# Patient Record
Sex: Female | Born: 1985 | Race: Black or African American | Hispanic: Yes
Health system: Midwestern US, Community
[De-identification: ages and names within clinical notes are randomized; demographics above are authoritative.]

## PROBLEM LIST (undated history)

## (undated) DIAGNOSIS — F329 Major depressive disorder, single episode, unspecified: Secondary | ICD-10-CM

## (undated) DIAGNOSIS — F909 Attention-deficit hyperactivity disorder, unspecified type: Secondary | ICD-10-CM

## (undated) DIAGNOSIS — G4700 Insomnia, unspecified: Secondary | ICD-10-CM

## (undated) DIAGNOSIS — F32A Depression, unspecified: Secondary | ICD-10-CM

---
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
## 2012-03-10 ENCOUNTER — Inpatient Hospital Stay
Admit: 2012-03-10 | Discharge: 2012-03-11 | Disposition: A | Discharge status: Home / Self Care | Attending: Emergency Medicine

## 2012-03-10 LAB — URINALYSIS
Bilirubin Urine: NEGATIVE
Glucose, Ur: NEGATIVE
Ketones, Urine: NEGATIVE
Nitrite, Urine: POSITIVE — AB
Protein, UA: NEGATIVE
Specific Gravity, UA: 1.019 (ref 1.005–1.030)
Urine Hgb: NEGATIVE
Urobilinogen, Urine: NORMAL
pH, UA: 7.5 (ref 5.0–8.0)

## 2012-03-10 LAB — MICROSCOPIC URINALYSIS: WBC, UA: 10 /HPF (ref 0–5)

## 2012-03-10 LAB — PREGNANCY, URINE: HCG(Urine) Pregnancy Test: NEGATIVE

## 2012-03-10 NOTE — ED Provider Notes (Signed)
I performed a history and physical examination of the patient and discussed management with the resident. I reviewed the resident's note and agree with the documented findings and plan of care. Any areas of disagreement are noted on the chart. I was personally present for the key portions of any procedures. I have documented in the chart those procedures where I was not present during the key portions. I have reviewed the emergency nurses triage note. I agree with the chief complaint, past medical history, past surgical history, allergies, medications, social and family history as documented unless otherwise noted below. Documentation of the HPI, Physical Exam and Medical Decision Making performed by medical students or scribes is based on my personal performance of the HPI, PE and MDM. For Phys Assistant/ Nurse Practitioner cases/documentation I have personally evaluated this patient and have completed at least one if not all key elements of the E/M (history, physical exam, and MDM). Additional findings are as noted.    Kayleen Memos. Orson Aloe, MD  Attending Emergency  Physician              Rudene Anda, MD  03/10/12 [phone]

## 2012-03-10 NOTE — Discharge Instructions (Signed)
* Read and follow all discharge instructions    * Fill prescriptions and take only as directed    * Please establish care with a primary care physician of your choice    * Return to ER if your symptoms do not improve, if you develop high fever, or if you begin to see blood in your urine.      Urinary Tract Infection  Infections of the urinary tract can start in several places. A bladder infection (cystitis), a kidney infection (pyelonephritis), and a prostate infection (prostatitis) are different types of urinary tract infections (UTIs). They usually get better if treated with medicines (antibiotics) that kill germs. Take all the medicine until it is gone. You or your child may feel better in a few days, but TAKE ALL MEDICINE or the infection may not respond and may become more difficult to treat.  HOME CARE INSTRUCTIONS    Drink enough water and fluids to keep the urine clear or pale yellow. Cranberry juice is especially recommended, in addition to large amounts of water.   Avoid caffeine, tea, and carbonated beverages. They tend to irritate the bladder.   Alcohol may irritate the prostate.   Only take over-the-counter or prescription medicines for pain, discomfort, or fever as directed by your caregiver.  To prevent further infections:   Empty the bladder often. Avoid holding urine for long periods of time.   After a bowel movement, women should cleanse from front to back. Use each tissue only once.   Empty the bladder before and after sexual intercourse.  FINDING OUT THE RESULTS OF YOUR TEST  Not all test results are available during your visit. If your or your child's test results are not back during the visit, make an appointment with your caregiver to find out the results. Do not assume everything is normal if you have not heard from your caregiver or the medical facility. It is important for you to follow up on all test results.  SEEK MEDICAL CARE IF:    There is back pain.   Your baby is older than  3 months with a rectal temperature of 100.5 F (38.1 C) or higher for more than 1 day.   Your or your child's problems (symptoms) are no better in 3 days. Return sooner if you or your child is getting worse.  SEEK IMMEDIATE MEDICAL CARE IF:    There is severe back pain or lower abdominal pain.   You or your child develops chills.   You have a fever.   Your baby is older than 3 months with a rectal temperature of 102 F (38.9 C) or higher.   Your baby is 63 months old or younger with a rectal temperature of 100.4 F (38 C) or higher.   There is nausea or vomiting.   There is continued burning or discomfort with urination.  MAKE SURE YOU:    Understand these instructions.   Will watch your condition.   Will get help right away if you are not doing well or get worse.  Document Released: 08/14/2005 Document Revised: 10/24/2011 Document Reviewed: 03/19/2007  [facility] Patient Information 2012 [place].    Chlamydia, Female  Chlamydia is an infection caused by bacteria. It is spread through sexual contact. Chlamydia can be in different areas of the body. These areas include the cervix, urethra, throat, or rectum. If you are infected, you must finish all treatments and follow up with a caregiver.   CAUSES   Chlamydia is a  sexually transmitted disease. It is passed from an infected partner during intimate contact. This contact could be with the genitals, mouth, or rectal area. Infections can also be passed from mothers to babies during birth.  SYMPTOMS   There may not be any symptoms. This is often the case early in the infection. Symptoms you may notice include:   Mild pain and discomfort when urinating.   Inflammation of the rectum.   Vaginal discharge.   Painful intercourse.   Abdominal pain.   Bleeding between menstrual periods.  DIAGNOSIS   To diagnose this infection, your caregiver will do a pelvic exam. Cultures will be taken of the vagina, cervix, urine, and possibly the rectum to see if the  infection is chlamydia.  TREATMENT  You will be given antibiotic medicines. Any sexual partners should also be treated, even if they do not show symptoms. Take the medicine for the prescribed length of time. If you are pregnant, do not take tetracycline-type antibiotics.  HOME CARE INSTRUCTIONS    Take your antibiotics as directed. Finish them even if you start to feel better.   Only take over-the-counter or prescription medicines for pain, discomfort, or fever as directed by your caregiver.   Inform any sexual partners about the infection. They should be treated also.   Do not have sexual contact until your caregiver tells you it is okay.   Get plenty of rest.   Eat a well-balanced diet, and drink enough fluids to keep your urine clear or pale yellow.   Keep all follow-up appointments and tests.  SEEK IMMEDIATE MEDICAL CARE IF:    Your symptoms return.   You have a fever.  MAKE SURE YOU:    Understand these instructions.   Will watch your condition.   Will get help right away if you are not doing well or get worse.  Document Released: 08/14/2005 Document Revised: 10/24/2011 Document Reviewed: 06/22/2008  [facility] Patient Information 2012 [employer].

## 2012-03-10 NOTE — ED Provider Notes (Signed)
[facility]  eMERGENCY dEPARTMENT encounter  Triage note    Pt Name: Jennifer Stark  MRN: [id number]  Birthdate [birth year]  Date of evaluation: 03/10/2012  Vital signs:   Filed Vitals:    03/10/12 1720   BP: 112/63   Pulse: 80   Temp: 98.2 F (36.8 C)   Resp: 16         5:24 PM  Initial Provider contact occurred in Triage performed by Denton Ar, PA.  The following history was obtained during triage: Patient with one week of vaginal irritation.  States she has used an over-the-counter yeast infection cream.  Received no relief from this.  States that she also has some "uncomfortableness" when she is urinating.  She denies any pain with urination.  No urgency or hesitancy.  No vaginal discharge.  She is unsure if she is pregnant.    Orders: UA, urine pregnancy        Denton Ar, PA-C        [place], [place]  03/10/12 1725

## 2012-03-10 NOTE — ED Notes (Signed)
Resident at bedside to evaluate      Braulio Bosch, RN  03/10/12 [phone]

## 2012-03-10 NOTE — ED Provider Notes (Signed)
The history is provided by the patient.   Urinary Tract Infection: Patient complains of dysuria, frequency She has had symptoms for 2 weeks. Patient also complains of headache. Patient denies back pain, cough, fever, rhinitis, sorethroat, stomach ache and vaginal discharge. Patient does not have a history of recurrent UTI.  Patient does not have a history of pyelonephritis. Patient states she has been sexually active with the same partner for approximately three months. Two weeks ago, they had intercourse without a condom. Shortly thereafter, she began experiencing the above symptoms. She tried over the [employer] which only marginally helped. She denies vaginal discharge and any foul smell. She denies abdominal pain or any other symptoms. She is eating well and drinking well. LMP was April 5th, it was normal in volume and duration. Her periods are regular.      Review of Systems   All other systems reviewed and are negative.    Except that mentioned in HPI    Physical Exam   Constitutional: She is oriented to person, place, and time. She appears well-developed and well-nourished. No distress.   HENT:   Head: Normocephalic and atraumatic.   Right Ear: External ear normal.   Left Ear: External ear normal.   Nose: Nose normal.   Mouth/Throat: Oropharynx is clear and moist. No oropharyngeal exudate.   Eyes: Conjunctivae and EOM are normal. Pupils are equal, round, and reactive to light.   Neck: Normal range of motion. Neck supple.   Cardiovascular: Normal rate, regular rhythm, normal heart sounds and intact distal pulses.  Exam reveals no gallop and no friction rub.    No murmur heard.  Pulmonary/Chest: Effort normal. No respiratory distress. She has no wheezes. She has no rales.   Abdominal: Bowel sounds are normal. She exhibits no distension and no mass. There is no tenderness. There is no rebound and no guarding.   Genitourinary: Uterus normal. There is no lesion on the right labia. There is no lesion on the  left labia. Cervix exhibits discharge. Cervix exhibits no motion tenderness. Right adnexum displays no mass, no tenderness and no fullness. Left adnexum displays no mass, no tenderness and no fullness. No erythema, tenderness or bleeding around the vagina. No foreign body around the vagina. No signs of injury around the vagina. No vaginal discharge found.       Examination aided by nurse Otila Back.   Neurological: She is alert and oriented to person, place, and time. No cranial nerve deficit.   Skin: Skin is warm and dry. No rash noted. She is not diaphoretic. No erythema. No pallor.       Procedures    MDM    Labs  Recent Results (from the past 2 hour(s))   PREGNANCY, URINE    Collection Time    03/10/12  6:35 PM       Result Value Range    HCG(Urine) Pregnancy Test NEGATIVE  NEG   URINALYSIS    Collection Time    03/10/12  6:35 PM       Result Value Range    Color, UA YELLOW  YEL    Turbidity UA CLOUDY (*) CLEAR    Glucose, Ur NEGATIVE  NEG    Bilirubin Urine NEGATIVE  NEG    Ketones, Urine NEGATIVE  NEG    Specific Gravity, UA 1.019  1.005 - 1.030    Urine Hgb NEGATIVE  NEG    pH, UA 7.5  5.0 - 8.0    Protein, UA NEGATIVE  NEG    Urobilinogen, Urine Normal  NORM    Nitrite, Urine POSITIVE (*) NEG    Leukocyte Esterase, Urine MOD (*) NEG    Urinalysis Comments NOT REPORTED      Cervical cultures pending.            Forrest Moron, MD  Resident  03/10/12 [phone]

## 2012-03-10 NOTE — ED Notes (Signed)
I reviewed the lab results obtained from the pelvic exam:    Recent Results (from the past 2 hour(s))   VAGINITIS DNA PROBE    Collection Time    03/10/12  7:43 PM       Result Value Range    Specimen VAGINA      Special Requests NOT REPORTED      Direct Exam NEGATIVE for Trichomonas vaginalis      Direct Exam NEGATIVE for Candida sp.      Direct Exam NEGATIVE for Gardnerella vaginalis      Direct Exam        Value: Method of testing is a DNA probe intended for detection and identification of    Direct Exam        Value:  Candida species, Gardnerella vaginalis, and Trichomonas vaginalis nucleic acid    Direct Exam        Value:  in vaginal fluid specimens from patients with symptoms of vaginitis/vaginosis.    Direct Exam        Value: [facility] [address] [place] [address] [phone]    Status FINAL 03/10/2012       Discussed with patient that prophylactic therapy would be beneficial for GC and Chlamydia. Patient elected to receive treatment.    Forrest Moron, MD  Resident  03/10/12 [phone]

## 2012-03-11 LAB — C. TRACHOMATIS / N. GONORRHOEAE, DNA
C. trachomatis DNA: POSITIVE — AB
N. gonorrhoeae DNA: NEGATIVE

## 2012-03-11 LAB — VAGINITIS DNA PROBE
Direct Exam: NEGATIVE
Direct Exam: NEGATIVE
Direct Exam: NEGATIVE

## 2012-03-11 MED ORDER — SULFAMETHOXAZOLE-TRIMETHOPRIM 400-80 MG PO TABS
400-80 MG | ORAL_TABLET | Freq: Two times a day (BID) | ORAL | Status: AC
Start: 2012-03-11 — End: 2012-03-13

## 2012-03-11 MED ORDER — SULFAMETHOXAZOLE-TRIMETHOPRIM 400-80 MG PO TABS
400-80 MG | ORAL_TABLET | Freq: Two times a day (BID) | ORAL | Status: DC
Start: 2012-03-11 — End: 2012-03-10

## 2012-03-11 MED ADMIN — cefTRIAXone (ROCEPHIN) injection 250 mg: INTRAMUSCULAR | @ 01:00:00 | NDC 00781320885

## 2012-03-11 MED ADMIN — azithromycin (ZITHROMAX) tablet 1,000 mg: ORAL | @ 01:00:00 | NDC 59762306002

## 2012-03-11 MED FILL — CEFTRIAXONE SODIUM 1 G IJ SOLR: 1 g | INTRAMUSCULAR | Qty: 1

## 2012-03-11 MED FILL — AZITHROMYCIN 250 MG PO TABS: 250 MG | ORAL | Qty: 4

## 2013-09-07 ENCOUNTER — Inpatient Hospital Stay
Admit: 2013-09-07 | Discharge: 2013-09-07 | Disposition: A | Discharge status: Home / Self Care | Attending: Emergency Medicine

## 2013-09-07 NOTE — Discharge Instructions (Signed)
Concussion: After Your Visit  Your Care Instructions     A concussion is a kind of injury to the brain. It happens when the head receives a hard blow. The impact can jar or shake the brain against the skull. This interrupts the brain's normal activities. Although you may have cuts or bruises on your head or face, you may have no other visible signs of a brain injury. In most cases, damage to the brain from a concussion can't be seen in tests such as a CT or MRI scan.  For a few weeks, you may have low energy, dizziness, trouble sleeping, a headache, ringing in your ears, or nausea. You may also feel anxious, grumpy, or depressed. You may have problems with memory and concentration. These symptoms are common after a concussion. They should slowly improve over time. Sometimes this takes weeks or even months. Someone who lives with you should know how to care for you. Please share this and all information with a caregiver who will be available to help if needed.  Follow-up care is a key part of your treatment and safety. Be sure to make and go to all appointments, and call your doctor if you are having problems. It's also a good idea to know your test results and keep a list of the medicines you take.  How can you care for yourself at home?  Pain control   Put ice or a cold pack on the part of your head that hurts for 10 to 20 minutes at a time. Put a thin cloth between the ice and your skin.   Be safe with medicines. Read and follow all instructions on the label.   If the doctor gave you a prescription medicine for pain, take it as prescribed.   If you are not taking a prescription pain medicine, ask your doctor if you can take an over-the-counter medicine.  Recovery   Have another adult watch you closely for the next 24 hours. That person should check for signs that your symptoms are getting worse.   Rest is the best way to recover from a concussion. You need to rest your body and your brain:   Get  plenty of sleep at night. And take rest breaks during the day.   Avoid activities that take a lot of physical or mental work. This includes housework, exercise, schoolwork, video games, text messaging, and using the computer.   You may need to change your school or work schedule while you recover.   Return to your normal activities slowly. Do not try to do too much at once.   Do not drink alcohol or use illegal drugs. Alcohol and illegal drugs can slow your recovery. And they can increase your risk of a second brain injury.   Avoid activities that could lead to another concussion. Avoid contact sports until your doctor says that you can do them.   Ask your doctor when it's okay for you to drive a car, ride a bike, or operate machinery.  How should you return to activity?  Your return to sports or activity should be gradual. It should only begin when all symptoms of a concussion are gone.  Doctors and concussion specialists suggest steps to follow for returning to sports after a concussion. Use these steps as a guide. Your doctor must always make the final decision about whether you are ready to go back to full-contact play. You should slowly progress through the following levels of activity:  1.   No activity. This means complete physical and mental rest.  2. Light aerobic activity, such as walking.  3. Sports-specific exercise, such as drills, but no head impact.  4. Noncontact training drills. This can include light resistance training.  5. Full-contact practice, but only if your doctor says you are ready.  6. Return to normal game play.  Watch and keep track of your progress. It should take at least 6 days for you to go from light activity to normal game play.  Make sure that you can stay at each new level of activity for at least 24 hours without symptoms, or as long as your doctor says, before doing more. If one or more symptoms come back, return to a lower level of activity for at least 24 hours. Don't  move on until all symptoms are gone.  When should you call for help?  Call 911 anytime you think you may need emergency care. For example, call if:   You have a seizure.   You passed out (lost consciousness).   You are confused or can't stay awake.  Call your doctor now or seek immediate medical care if:   You have new or worse vomiting.   You feel less alert.   You have new weakness or numbness in any part of your body.  Watch closely for changes in your health, and be sure to contact your doctor if:   You do not get better as expected.   You have new symptoms, such as headaches, trouble concentrating, or changes in mood.   Where can you learn more?   Go to [email or web address] and sign in to your MyChart account. Enter Z711 in the Search Health Information box to learn more about "Concussion: After Your Visit."    If you do not have an account, please click on the "Sign Up Now" link.      2006-2014 [employer]. Care instructions adapted under license by [facility]. This care instruction is for use with your licensed healthcare professional. If you have questions about a medical condition or this instruction, always ask your healthcare professional. Healthwise, Inc[employer] disclaims any warranty or liability for your use of this information.  Content Version: 10.1.311062; Current as of: January 27, 2013

## 2013-09-07 NOTE — ED Notes (Signed)
Pt resting on cart, respirations are equal and nonlabored, no distress noted. Pt updated on poc and duration, continue to monitor, awaiting CT scan.     Braulio Bosch, RN  09/07/13 [phone]

## 2013-09-08 NOTE — ED Provider Notes (Signed)
[facility] ED  EMERGENCY dEPARTMENT eNCOUnter  Resident    Pt Name: Jennifer Stark  MRN: [id number]  Birthdate 1986/10/29  Date of evaluation: 09/07/2013  PCP:  No primary provider on file.    CHIEF COMPLAINT       Chief Complaint   Patient presents with   ??? Jaw Pain     hit by cart at work, no neck pain on palpation,  pos loc , dizzy   ??? Headache         HISTORY OF PRESENT ILLNESS    Jennifer Stark is a 27 y.o. female who presents for evaluation of Mild headache that began just prior to arrival when she was struck by a golf cart-like machine at work and knocked over.  She did have loss of consciousness initially and some vision changes after she woke up.  She denies any blood thinners.  She denies any midline neck or back pain.         REVIEW OF SYSTEMS         Constitutional: Denies recent fever, chills.  HEENT: No visual changes, no neck pain, no dental pain, no sore throat +headache  Respiratory: Denies recent shortness of breath.  No cough.    Cardiac:  Denies recent chest pain.  Denies palpitations.    GI: Denies Blood in the stool or black tarry stools.  No constipation or diarrhea.  No nausea.  No vomiting.  GU: Denies hematuria, denies dysuria.    Musculoskeletal: Denies joint pain,   Denies myalgias  Neurologic:  Denies focal weakness, no visio changes, no paresthesias, no headache  Skin:  Denies any rash.      Negative in 10 essential Systems except as mentioned above and in the HPI.      PAST MEDICAL HISTORY   Patient's past medical history reviewed and is not significant to case.        SURGICAL HISTORY       Patient's past surgical history reviewed, not significant to case.    CURRENT MEDICATIONS       Discharge Medication List as of 09/07/2013  2:15 AM      CONTINUE these medications which have NOT CHANGED    Details   acetaminophen (TYLENOL) 325 MG tablet Take 650 mg by mouth every 6 hours as needed.             ALLERGIES     has No Known Allergies.    FAMILY HISTORY        Patient's family medical history reviewed, not significant to case.    SOCIAL HISTORY      reports that  drinks alcohol. She reports that she does not use illicit drugs.    PHYSICAL EXAM         Constitutional:  Well developed, no acute distress   Eyes:  Pupils equal and readily reactive to light  HENT:  Atraumatic, external ears normal, nose normal, oropharynx moist. Neck- supple   Respiratory:  Clear to auscultation bilaterally with good air exchange  Cardiovascular:  RRR with normal S1 and S2 and no murmurs, gallops or rubs  GI:  Soft, nondistended/normal BS, and nontender   Musculoskeletal:  No deformities.  No edema.  No tenderness.  Back:  No CVA tenderness.  Normal to inspection.    Integument:  No rash.    Neurologic:  Alert & oriented x 3, no focal deficits noted     INITIAL VITALS:  oral temperature is 97.9 ??  F (36.6 ??C). Her blood pressure is 111/68 and her pulse is 74. Her respiration is 14 and oxygen saturation is 100%.          DIAGNOSTIC RESULTS     EKG: All EKG's are interpreted by the Emergency Department Physician who either signs or Co-signs this chart in the absence of a cardiologist.        RADIOLOGY:   I directly visualized the following  images and reviewed the radiologist interpretations:      ED BEDSIDE ULTRASOUND:       LABS:  Labs Reviewed - No data to display        ED COURSE AND MEDICAL DECISIO MAKING:   Vitals:    Filed Vitals:    09/07/13 0052 09/07/13 0054   BP: 111/68    Pulse: 74    Temp: 97.9 ??F (36.6 ??C) 97.9 ??F (36.6 ??C)   TempSrc: Oral    Resp: 14    SpO2: 100%      Given the patient had loss of consciousness and vision changes CT of her brain was done to rule out any acute process.  This was negative.  Patient remained asymptomatic during her observation here.  Patient was discharged home in stable condition    CRITICAL CARE:      CONSULTS:  None      PROCEDURES:      FINAL IMPRESSION      1. Closed head injury, initial encounter            DISPOSITION/PLAN   DISPOSITION  Decision to Discharge      PATIENT REFERRED TO:  [facility] ED  [address]  [place] [address]  [phone]    As needed, If symptoms worsen      DISCHARGE MEDICATIONS:  Discharge Medication List as of 09/07/2013  2:15 AM          (Please note that portions of this note were completed with a voice recognition program.  Efforts were made to edit the dictations but occasionally words are mis-transcribed.)    Vito Berger  Emergency Medicine Resident      Vito Berger, MD  Resident  09/08/13 [phone]

## 2013-09-13 NOTE — ED Provider Notes (Signed)
[facility]     Emergency Department     Faculty Attestation    I performed a history and physical examination of the patient and discussed management with the resident. I reviewed the resident's note and agree with the documented findings and plan of care. Any areas of disagreement are noted on the chart. I was personally present for the key portions of any procedures. I have documented in the chart those procedures where I was not present during the key portions. I have reviewed the emergency nurses triage note. I agree with the chief complaint, past medical history, past surgical history, allergies, medications, social and family history as documented unless otherwise noted below. Documentation of the HPI, Physical Exam and Medical Decision Making performed by medical students or scribes is based on my personal performance of the HPI, PE and MDM. For Physician Assistant/ Nurse Practitioner cases/documentation I have personally evaluated this patient and have completed at least one if not all key elements of the E/M (history, physical exam, and MDM). Additional findings are as noted.        Ilsa Bonello MD  Attending Emergency  Physician            Gabreille Dardis L Vinh Sachs, MD  09/13/13 0128

## 2016-01-18 DIAGNOSIS — S161XXA Strain of muscle, fascia and tendon at neck level, initial encounter: Secondary | ICD-10-CM

## 2016-01-18 NOTE — ED Provider Notes (Signed)
[facility] ED  Emergency Department Encounter  Emergency Medicine Resident     Pt Name: Jennifer Stark  MRN: [id number]  Birthdate November 07, 1986  Date of evaluation: 01/18/16  PCP:  No primary care provider on file.    CHIEF COMPLAINT       Chief Complaint   Patient presents with   ??? Neck Pain     left side, sts was trying to catch a drill that went out of control. happened last night   ??? Shoulder Pain   ??? Headache       HISTORY OF PRESENT ILLNESS  (Location/Symptom, Timing/Onset, Context/Setting, Quality, Duration, Modifying Factors, Severity.)      Jennifer Stark is a 30 y.o. female who presents with chief complaint of left-sided neck pain/shoulder pain after attempting to catch a drill that was out-of-control at work.  Patient states this happened last night while at work.  Patient denies any acute trauma to her neck or head.  Patient states she has had a mild headache.  Patient denies any focal neurologic deficits associated with her headache or neck pain.  Patient states the pain is mainly in the muscles on the left side of her neck and does not radiate.  Patient states movement makes it worse and rest makes it better.  Patient states she is prescribed Flexeril at home by her primary care physician which she has not taken for this pain.  Patient denies taking any ibuprofen or Tylenol.  Patient states she has been using a warm compress but came to the emergency room to make sure it is nothing more serious.     PAST MEDICAL / SURGICAL / SOCIAL / FAMILY HISTORY     Patient denies any past medical history    Patient denies any past surgical history    Social History     Social History   ??? Marital status: Legally Separated     Spouse name: N/A   ??? Number of children: N/A   ??? Years of education: N/A     Occupational History   ??? Not on file.     Social History Main Topics   ??? Smoking status: Not on file   ??? Smokeless tobacco: Not on file   ??? Alcohol use 0.0 oz/week     2 - 3 Glasses of wine  per week   ??? Drug use: No   ??? Sexual activity: Not on file     Other Topics Concern   ??? Not on file     Social History Narrative       History reviewed. No pertinent family history.    Allergies:  Review of patient's allergies indicates no known allergies.    Home Medications:  Prior to Admission medications    Medication Sig Start Date End Date Taking? Authorizing Provider   cyclobenzaprine (FLEXERIL) 10 MG tablet Take 10 mg by mouth 3 times daily as needed for Muscle spasms   Yes Historical Provider, MD   traZODone (DESYREL) 50 MG tablet Take 50 mg by mouth nightly   Yes Historical Provider, MD   ibuprofen (ADVIL;MOTRIN) 800 MG tablet Take 1 tablet by mouth every 8 hours as needed for Pain 01/18/16  Yes Catha Brow, MD   acetaminophen (TYLENOL) 325 MG tablet Take 650 mg by mouth every 6 hours as needed.    Historical Provider, MD       REVIEW OF SYSTEMS    (2-9 systems for level 4, 10 or more  for level 5)      Review of Systems   Constitutional: Negative for chills, diaphoresis and fever.   HENT: Negative for congestion and sore throat.    Respiratory: Negative for chest tightness and shortness of breath.    Cardiovascular: Negative for chest pain.   Gastrointestinal: Negative for abdominal pain, constipation, diarrhea, nausea and vomiting.   Genitourinary: Negative for dysuria, frequency and urgency.   Musculoskeletal: Positive for myalgias and neck stiffness. Negative for arthralgias and back pain.   Skin: Negative for rash and wound.   Neurological: Negative for weakness and numbness.   Psychiatric/Behavioral: Negative for agitation, behavioral problems and confusion.       PHYSICAL EXAM   (up to 7 for level 4, 8 or more for level 5)      INITIAL VITALS:   Visit Vitals   ??? BP 122/81   ??? Pulse 73   ??? Temp 99.1 ??F (37.3 ??C) (Oral)   ??? Resp 16   ??? Ht 5\' 2"  (1.575 m)   ??? Wt 110 lb (49.9 kg)   ??? LMP 01/18/2016   ??? SpO2 98%   ??? BMI 20.12 kg/m2       Physical Exam   Constitutional: She is oriented to person, place,  and time. She appears well-developed and well-nourished. No distress.   HENT:   Head: Normocephalic and atraumatic.   Nose: Nose normal.   Mouth/Throat: Oropharynx is clear and moist.   Eyes: Conjunctivae and EOM are normal. Pupils are equal, round, and reactive to light.   Neck: Normal range of motion. Neck supple.   Cardiovascular: Normal rate, regular rhythm, normal heart sounds and intact distal pulses.    Pulmonary/Chest: Effort normal and breath sounds normal. No respiratory distress. She has no wheezes. She has no rales.   Abdominal: Soft. She exhibits no distension. There is no tenderness. There is no rebound.   Musculoskeletal: Normal range of motion. She exhibits no edema.        Cervical back: She exhibits tenderness (trapezius and paraspinal tenderness on the left cervical region). She exhibits normal range of motion, no bony tenderness, no swelling, no edema and no deformity.        Back:    No midline cervical, thoracic or lumbar tenderness to palpation, no step-offs or deformities, strength 5/5 bilateral upper and lower extremities   Neurological: She is alert and oriented to person, place, and time. She has normal strength and normal reflexes. She is not disoriented. No sensory deficit. She exhibits normal muscle tone. GCS eye subscore is 4. GCS verbal subscore is 5. GCS motor subscore is 6.   Skin: Skin is warm and dry. She is not diaphoretic.   Psychiatric: She has a normal mood and affect. Her behavior is normal.   Nursing note and vitals reviewed.      DIFFERENTIAL  DIAGNOSIS     PLAN (LABS / IMAGING / EKG):  No orders of the defined types were placed in this encounter.      MEDICATIONS ORDERED:  Orders Placed This Encounter   Medications   ??? ibuprofen (ADVIL;MOTRIN) 800 MG tablet     Sig: Take 1 tablet by mouth every 8 hours as needed for Pain     Dispense:  30 tablet     Refill:  0       DDX: Muscle strain, muscle spasm, ligamentous injury, fracture, dislocation, contusion    30 year old female  presenting with left trapezius and paraspinal muscle tenderness after  straining herself at work last night.  Patient has no neurologic deficits and no midline cervical, thoracic or lumbar spine tenderness.  Suspicion for muscle strain/muscle spasm is high secondary to patient's straining himself at work last night prior to the onset of symptoms.    Plan is for ibuprofen and discharged to follow with primary care physician.  Patient is artery taking Flexeril at home prescribed by her primary care physician.  Patient instructed to continue taking the Flexeril with the ibuprofen.    DIAGNOSTIC RESULTS / EMERGENCY DEPARTMENT COURSE / MDM     LABS:  No results found for this visit on 01/18/16.    IMPRESSION: Trapezius strain    RADIOLOGY:  None    EKG  None    All EKG's are interpreted by the Emergency Department Physician who either signs or Co-signs this chart in the absence of a cardiologist.    EMERGENCY DEPARTMENT COURSE:  Patient evaluated by myself and attending physician.  Patient treated with ibuprofen.  Patient discharged with instructions to follow up with primary care physician and to continue taking the Flexeril with her ibuprofen.    PR   QUESTIONS ANSWERED.OCEDURES:  None    CONSULTS:  None    CRITICAL CARE:  None    FINAL IMPRESSION      1. Neck muscle strain, initial encounter          DISPOSITION / PLAN     DISPOSITION Decision to Discharge    PATIENT REFERRED TO:  PCP from clinic list    Call in 1 day  to follow up with PCP from clinic list.       DISCHARGE MEDICATIONS:  Discharge Medication List as of 01/18/2016  9:59 PM      START taking these medications    Details   ibuprofen (ADVIL;MOTRIN) 800 MG tablet Take 1 tablet by mouth every 8 hours as needed for Pain, Disp-30 tablet, R-0Print             Catha Brow, MD  Emergency Medicine Resident    (Please note that portions of this note were completed with a voice recognition program.  Efforts were made to edit the dictations but occasionally words are  mis-transcribed.)     Catha Brow, MD  Resident  01/18/16 [phone]

## 2016-01-18 NOTE — ED Provider Notes (Signed)
[facility] ED     Emergency Department     Faculty Attestation    I performed a history and physical examination of the patient and discussed management with the resident. I reviewed the resident???s note and agree with the documented findings and plan of care. Any areas of disagreement are noted on the chart. I was personally present for the key portions of any procedures. I have documented in the chart those procedures where I was not present during the key portions. I have reviewed the emergency nurses triage note. I agree with the chief complaint, past medical history, past surgical history, allergies, medications, social and family history as documented unless otherwise noted below. For Physician Assistant/ Nurse Practitioner cases/documentation I have personally evaluated this patient and have completed at least one if not all key elements of the E/M (history, physical exam, and MDM). Additional findings are as noted.    Patient presents with left-sided neck pain that began last night after she caught a drill while at work.  She says that she twisted her neck and has had pain since that time.  She denies any radiation of the pain.  She denies weakness, numbness, tingling to her extremities.  She denies fever, chills, chest pain, shortness of breath, nausea or vomiting.  On exam, patient is sitting on the bed and appears well.  Lungs are clear to auscultation bilaterally heart sounds are normal.  There is no midline cervical tenderness.  There is mild tenderness over the left trapezius muscle.  Strength and sensation is intact to the bilateral upper extremities.  We'll treat patient's pain with Flexeril and Motrin.      Melynda Keller, MD  Attending Emergency  Physician             Jesus Genera, MD  01/18/16 2200

## 2016-01-19 ENCOUNTER — Inpatient Hospital Stay
Admit: 2016-01-19 | Discharge: 2016-01-19 | Disposition: A | Discharge status: Home / Self Care | Payer: PRIVATE HEALTH INSURANCE | Attending: Emergency Medicine

## 2016-01-19 MED ORDER — IBUPROFEN 800 MG PO TABS
800 MG | ORAL_TABLET | Freq: Three times a day (TID) | ORAL | 0 refills | Status: DC | PRN
Start: 2016-01-19 — End: 2016-05-16

## 2016-01-20 ENCOUNTER — Inpatient Hospital Stay
Admit: 2016-01-20 | Discharge: 2016-01-20 | Disposition: A | Discharge status: Home / Self Care | Payer: PRIVATE HEALTH INSURANCE | Attending: Emergency Medicine

## 2016-01-20 DIAGNOSIS — M62838 Other muscle spasm: Secondary | ICD-10-CM

## 2016-01-20 MED ORDER — ACETAMINOPHEN 325 MG PO TABS
325 MG | ORAL_TABLET | Freq: Four times a day (QID) | ORAL | 0 refills | Status: DC | PRN
Start: 2016-01-20 — End: 2016-05-16

## 2016-01-20 MED ORDER — CYCLOBENZAPRINE HCL 10 MG PO TABS
10 MG | ORAL_TABLET | Freq: Three times a day (TID) | ORAL | 0 refills | Status: AC | PRN
Start: 2016-01-20 — End: 2016-01-30

## 2016-01-20 NOTE — ED Provider Notes (Signed)
[facility] ED  Emergency Department Encounter  Emergency Medicine Resident     Pt Name: Jennifer Stark  MRN: [id number]  Birthdate [birth year]  Date of evaluation: 01/20/16  PCP:  No primary care provider on file.    CHIEF COMPLAINT       Chief Complaint   Patient presents with   ??? Neck Pain     flare up of chronic neck pain since wednesday after injuring at work. pt states has been out of pain meds since last week as well       HISTORY OF PRESENT ILLNESS  (Location/Symptom, Timing/Onset, Context/Setting, Quality, Duration, Modifying Factors, Severity.)      Jennifer Stark is a 30 y.o. female who presents with left shoulder/neck pain going on since last week.  Patient states that she was using a drill at work where she works at [employer] and caused the injury.  She has had persistent symptoms.  He has been taking Tylenol and Flexeril with symptomatic improvement however is only able take a Flexeril prior to going to bed because of making her feel drowsy.  She is here today requesting refill Flexeril as well as note excusing her from work today.  She denies any numbness or tingling.  She denies any headaches.  She has had some sneezing and nasal congestion lately.     PAST MEDICAL / SURGICAL / SOCIAL / FAMILY HISTORY      has no past medical history on file.     has no past surgical history on file.    Social History     Social History   ??? Marital status: Legally Separated     Spouse name: N/A   ??? Number of children: N/A   ??? Years of education: N/A     Occupational History   ??? Not on file.     Social History Main Topics   ??? Smoking status: Not on file   ??? Smokeless tobacco: Not on file   ??? Alcohol use 0.0 oz/week     2 - 3 Glasses of wine per week   ??? Drug use: No   ??? Sexual activity: Not on file     Other Topics Concern   ??? Not on file     Social History Narrative       History reviewed. No pertinent family history.    Allergies:  Review of patient's allergies indicates no known  allergies.    Home Medications:  Prior to Admission medications    Medication Sig Start Date End Date Taking? Authorizing Provider   acetaminophen (TYLENOL) 325 MG tablet Take 2 tablets by mouth every 6 hours as needed for Pain 01/20/16  Yes Warrick Parisian, DO   cyclobenzaprine (FLEXERIL) 10 MG tablet Take 1 tablet by mouth 3 times daily as needed for Muscle spasms 01/20/16 01/30/16 Yes Warrick Parisian, DO   traZODone (DESYREL) 50 MG tablet Take 50 mg by mouth nightly    Historical Provider, MD   ibuprofen (ADVIL;MOTRIN) 800 MG tablet Take 1 tablet by mouth every 8 hours as needed for Pain 01/18/16   Catha Brow, MD       REVIEW OF SYSTEMS    (2-9 systems for level 4, 10 or more for level 5)      Review of Systems   Constitutional: Negative for chills and fever.   HENT: Positive for congestion and sneezing. Negative for sore throat.    Respiratory: Negative for cough  and shortness of breath.    Gastrointestinal: Negative for abdominal pain, nausea and vomiting.   Musculoskeletal: Positive for myalgias and neck pain. Negative for arthralgias and back pain.   Skin: Negative for rash and wound.       PHYSICAL EXAM   (up to 7 for level 4, 8 or more for level 5)      INITIAL VITALS:   Visit Vitals   ??? BP (!) 121/95   ??? Pulse 103   ??? Temp 100 ??F (37.8 ??C) (Oral)   ??? Resp 16   ??? Ht 5\' 2"  (1.575 m)   ??? Wt 102 lb (46.3 kg)   ??? LMP 01/18/2016   ??? SpO2 100%   ??? BMI 18.66 kg/m2       Physical Exam   Constitutional: She is oriented to person, place, and time. She appears well-developed and well-nourished.   HENT:   Head: Normocephalic and atraumatic.   Musculoskeletal: Normal range of motion. She exhibits tenderness. She exhibits no edema or deformity.   There is no midline cervical, thoracic, lumbar tenderness.  There is some tenderness over the soft tissues over the trapezius on the left side.  Patient has full range of motion.  There is no sensation deficit.   Neurological: She is alert and oriented to person, place, and time.   Skin:  Skin is warm and dry. No rash noted. No erythema.   Nursing note and vitals reviewed.      DIFFERENTIAL  DIAGNOSIS     PLAN (LABS / IMAGING / EKG):  No orders of the defined types were placed in this encounter.      MEDICATIONS ORDERED:  Orders Placed This Encounter   Medications   ??? acetaminophen (TYLENOL) 325 MG tablet     Sig: Take 2 tablets by mouth every 6 hours as needed for Pain     Dispense:  50 tablet     Refill:  0   ??? cyclobenzaprine (FLEXERIL) 10 MG tablet     Sig: Take 1 tablet by mouth 3 times daily as needed for Muscle spasms     Dispense:  20 tablet     Refill:  0       DDX: Left trapezius muscle strain, no midline tenderness, no evidence of bony involvement.  Full range of motion, no meningeal signs.  Likely start of early upper respiratory tract infection given the nasal congestion and sneezing, this is most likely treating to her prolonged tachycardia as well as near fever.  We will provide Flexeril as well as work note excusing her from work today, patient follow up with PCP if symptoms are not improving for possible physical therapy referral.  If anything worsens or changes she can always come back here as well.    DIAGNOSTIC RESULTS / EMERGENCY DEPARTMENT COURSE / MDM     EMERGENCY DEPARTMENT COURSE:  As above    PROCEDURES:  None    CONSULTS:  None    CRITICAL CARE:  None    FINAL IMPRESSION      1. Trapezius muscle spasm    2. Upper respiratory tract infection, unspecified type          DISPOSITION / PLAN     DISPOSITION Decision to Discharge    PATIENT REFERRED TO:  PCP    In 2 days      [facility]  [address]  [place] [id number]  [phone]    As needed, If  symptoms worsen      DISCHARGE MEDICATIONS:  New Prescriptions    ACETAMINOPHEN (TYLENOL) 325 MG TABLET    Take 2 tablets by mouth every 6 hours as needed for Pain    CYCLOBENZAPRINE (FLEXERIL) 10 MG TABLET    Take 1 tablet by mouth 3 times daily as needed for Muscle spasms       Warrick Parisian, DO  Emergency  Medicine Resident    (Please note that portions of this note were completed with a voice recognition program.  Efforts were made to edit the dictations but occasionally words are mis-transcribed.)       Warrick Parisian, DO  Resident  01/20/16 [phone]

## 2016-01-20 NOTE — ED Provider Notes (Signed)
[facility]     Emergency Department     Faculty Attestation        I performed a history and physical examination of the patient and discussed management with the resident. I reviewed the resident???s note and agree with the documented findings and plan of care. Any areas of disagreement are noted on the chart. I was personally present for the key portions of any procedures. I have documented in the chart those procedures where I was not present during the key portions. I have reviewed the emergency nurses triage note. I agree with the chief complaint, past medical history, past surgical history, allergies, medications, social and family history as documented unless otherwise noted below.  For Physician Assistant/ Nurse Practitioner cases/documentation I have personally evaluated this patient and have completed at least one if not all key elements of the E/M (history, physical exam, and MDM). Additional findings are as noted.    Vital Signs:BP: (!) 121/95   Pulse: 103   Resp: 16   Temp: 100 ??F (37.8 ??C)  SpO2: 100 %  PCP:  No primary care provider on file.    Pertinent Comments:         Critical Care  None    Note, if the patient's blood pressure was elevated, and they have no history of hypertension, they were informed of the following:  The patient may have Pre-hypertension/Hypertension: The patient has been informed that they may have pre-hypertension or Hypertension based on a blood pressure reading in the emergency department. I recommend that the patient call the primary care provider listed on their discharge instructions or a physician of their choice this week to arrange follow up for further evaluation of possible pre-hypertension or Hypertension.    (Please note that portions of this note were completed with a voice recognition program. Efforts were made to edit the dictations but occasionally words are mis-transcribed. Whenever words are used in  this note in any gender, they shall be construed as though they were used in the gender appropriate to the circumstances; and whenever words are used in this note in the singular or plural form, they shall be construed as though they were used in the form appropriate to the circumstances.)    Chevis PrettyAaron Mishka Stegemann, MD Lacie ScottsFAAEM FACEP  Attending Emergency Medicine Physician            Salomon MastAaron M Yana Schorr, MD  01/20/16 [phone]

## 2016-05-16 ENCOUNTER — Ambulatory Visit
Admit: 2016-05-16 | Discharge: 2016-05-16 | Payer: PRIVATE HEALTH INSURANCE | Attending: Neurology | Primary: Family Medicine

## 2016-05-16 DIAGNOSIS — R200 Anesthesia of skin: Secondary | ICD-10-CM

## 2016-05-16 MED ORDER — SUMATRIPTAN SUCCINATE 50 MG PO TABS
50 MG | ORAL_TABLET | ORAL | 1 refills | Status: DC
Start: 2016-05-16 — End: 2016-06-06

## 2016-05-16 MED ORDER — TIZANIDINE HCL 2 MG PO TABS
2 MG | ORAL_TABLET | ORAL | 1 refills | Status: AC
Start: 2016-05-16 — End: ?

## 2016-05-16 MED ORDER — AMITRIPTYLINE HCL 10 MG PO TABS
10 MG | ORAL_TABLET | ORAL | 1 refills | Status: AC
Start: 2016-05-16 — End: ?

## 2016-05-16 NOTE — Progress Notes (Addendum)
NEUROLOGY CONSULT  Patient Name:       Jennifer Stark  DOB:        08/13/1986  Clinic Visit Date:    05/16/2016    Dear Dr. Windy Kalata, MD     I had the opportunity to see your patient, Jennifer Stark in neurology consultation today. As you know she  is a 30 y.o. right handed Hispanic female presented with c/o worsening headaches in frequency and severity.  She stated that she had headaches since October 2014 and they were occurring once a week or so.  But for the past couple of months, she has been having increasing headaches in frequency and severity.  There is change in the pattern of headaches.  She was having pressure-like headaches earlier and presently there are occurring with throbbing and pounding pain superimposed on pressure-like headaches located all over the head associated with numbness involving left side of the body predominantly in left approximate date.  She also has neck pain radiating down the left arm.  She does have numbness involving left upper extremity.  She does have limitation of neck movement.  Admits to activation of the neck pain with neck movement.  Has subjective weakness of left upper extremity.  Denies bladder dysfunction.  Admits to having intermittent electric shocklike sensation upon flexing the neck.  She also stated that progressive neck pain and left upper extremity symptomatology affecting her ability to work.  She has had history of injury to left side of the body in October 2014 where she was hit by a forklift on left side of the body with head injury.  She was not down onto the ground with loss of consciousness for a few seconds.  She woke up dizzy with the slurred speech lasting for 15 minutes.  At the time she was evaluated at [facility].  CT head in October 2014 was unremarkable.  She has been having worsening headaches in frequency and severity with the change in the pattern of the headaches as stated above.  These headaches are  described as throbbing and pounding headaches with daily pressure-like headaches located on back of the head and becoming generalized at times associated with nausea and vomiting.  She also has been having photophobia.  Denied phonophobia.  She also has been feeling dizzy with vertigo lasting for few minutes.  She feels like about to pass out but never loss consciousness.  She has no speech and swallowing difficulties.  These headaches are lasting for greater than a few hours up to 1 day and they are occurring at least 4 times a week.  She does have aura of abnormal sensation prior to having these headaches.  She does have intermittent bilateral upper extremity numbness, left greater than right.  She also has been feeling weakness in lower extremities with the neck pain.  Denied bladder dysfunction.  Admits to having intermittent clumsiness in the hands.  Denies bowel incontinence.  Admits to electric shocklike sensation upon bending the neck as stated above.  She has tried conservative measures without any help.  She has been taking Excedrin twice a day for 4 days in a week.  At times she takes Tylenol and ibuprofen almost on a daily basis with refractory headaches.       REVIEW OF SYSTEMS  Constitutional Weight changes: present, Fevers : absent, Fatigue: present; Any recent hospitalizations:  absent.   HEENT Ears: normal, Eyes: no corrective lenses, Visual disturbance: absent  Reespiratory Shortness of breath: absent, Cough: absent   Cardivascular Chest pain: absent, Leg swelling :absent   GI Constipation: absent, Diarrhea: absent, Swallowing change: absent   GU Urinary frequency: absent, Urinary urgency: absent, Urinary incontinence: absent   Musculoskeletal Neck pain: present, Back pain: absent, Stiffness: absent, Muscle pain: absent, Joint pain: absent   Dermatological Hair loss: present, Skin changes: absent   Neurological Memory loss: absent, Confusion: absent, Seizures: absent; Trouble walking or imbalance:  absent, Dizziness: present, Weakness: absent, Numbness present, Tremor: absent, Spasm: absent, Speech difficulty: absent, Headache: present, Light sensitivity: present   Psychiatric Anxiety: present, Depression  present, Suicidal ideations absent   Hematologic Abnormal bleeding: absent, Anemia: absent, Clotting disorder: absent, Lymph gland changes: absent     No current outpatient prescriptions on file prior to visit.     No current facility-administered medications on file prior to visit.      Allergies: Jennifer Stark has No Known Allergies.    Past Medical History:   Diagnosis Date   ??? Head injury    ??? Headache     Migraine       History reviewed. No pertinent surgical history.  Social History: Jennifer Stark  reports that she has never smoked. She has never used smokeless tobacco. She reports that she drinks alcohol. She reports that she does not use illicit drugs.    History reviewed. No pertinent family history.    On exam: Blood pressure 109/66, pulse 76, height 5\' 2"  (1.575 m), weight 115 lb 3.2 oz (52.3 kg), not currently breastfeeding.    GENERAL  Appears uncomfortable and in distress   HEENT  NC/ AT   NECK  Supple and no bruits heard   MENTAL STATUS:  Alert, oriented, intact memory, no confusion, normal speech, normal language, no hallucination or delusion   CRANIAL NERVES: II     -      Visual fields intact to confrontation  III,IV,VI -  EOMs full, no afferent defect, no                      INO, no ptosis  V     -     Normal facial sensation  VII    -     Normal facial symmetry  VIII   -     Intact hearing  IX,X -     Symmetrical palate  XI    -     Symmetrical shoulder shrug  XII   -     Midline tongue, no atrophy    MOTOR FUNCTION:  significant for good strength of grade 5/5 in bilateral proximal and distal muscle groups of both upper and lower extremities with normal bulk, normal tone and no involuntary movements, no tremor   SENSORY FUNCTION:  Normal touch, normal pin, normal vibration,  normal proprioception   CEREBELLAR FUNCTION:  Intact fine motor control over upper limbs   REFLEX FUNCTION:  Symmetric, no perverted reflex, no Babinski sign   STATION and GAIT  Normal station, normal gait       Diagnostic data reviewed with the patient:   CT Head (09/07/13): No acute intracranial abnormalities.            Impression and Plan: Ms. Jennifer Stark is a 30 y.o. female with   Worsening headaches in frequency and severity; change in the pattern of headaches with new onset vertigo; history of head injury  Progressive neck pain with new onset sensorimotor symptomatology  involving left side; Lt UE > Lt LE raising concern for cervical myelopathy  Hx of migrainous headaches with aura    Recommend MRI brain and MRI cervical spine (with and without contrast)  Will start her on amitriptyline 10 mg nightly with gradual dose escalation along with Imitrex as needed for severe attacks.  Will also start her on tizanidine 2 mg nightly for neck pain and muscle spasms.  Discussed about conservative measures and avoidance of trigger factors including Relaxing in darker, quiet room and using darker shade sunglasses; applying hot or cold compresses to head and neck;  applying pressure to scalp muscles and temples; small amounts of caffeinated beverages and also establishing regular sleep hours and also to not to skip meals, etc.  Also to avoid certain foods known to trigger migraines such as aged cheese, chocolate etc.    I personally spent total of 60 min from 10:45 AM to 11:45 AM with the patient while interviewing the patient, examining the patient and reviewing the data. More than half of this face-to-face time was spent counseling the patient about  headaches, neck pain and various pharmacologic and conservative measures to help relieve those symptomatology.  Amitriptyline, Imitrex, tizanidine Medication Counseling: risks and benefits including possible adverse effects discussed with the patient and she  verbalized understanding those instructions.  Follow up in 3-4 weeks.     Addendum:   Patient stated that she has had "couple of ED visits" for severe neck pain and worsening headaches.  She was evaluated in ED in March this year twice at [facility] and she also has had few visits with her PCP.

## 2016-05-20 ENCOUNTER — Encounter: Payer: PRIVATE HEALTH INSURANCE | Attending: Neurology | Primary: Family Medicine

## 2016-06-05 ENCOUNTER — Inpatient Hospital Stay: Admit: 2016-06-05 | Payer: PRIVATE HEALTH INSURANCE | Primary: Family Medicine

## 2016-06-05 DIAGNOSIS — M542 Cervicalgia: Secondary | ICD-10-CM

## 2016-06-05 MED ORDER — GADOPENTETATE DIMEGLUMINE 469.01 MG/ML IV SOLN
469.01 MG/ML | Freq: Once | INTRAVENOUS | Status: AC | PRN
Start: 2016-06-05 — End: 2016-06-05
  Administered 2016-06-05: 21:00:00 10 mL via INTRAVENOUS

## 2016-06-05 MED ORDER — NORMAL SALINE FLUSH 0.9 % IV SOLN
0.9 % | Freq: Two times a day (BID) | INTRAVENOUS | Status: DC
Start: 2016-06-05 — End: 2016-06-08

## 2016-06-06 ENCOUNTER — Ambulatory Visit
Admit: 2016-06-06 | Discharge: 2016-06-06 | Payer: PRIVATE HEALTH INSURANCE | Attending: Neurology | Primary: Family Medicine

## 2016-06-06 DIAGNOSIS — G44221 Chronic tension-type headache, intractable: Secondary | ICD-10-CM

## 2016-06-06 MED ORDER — TOPIRAMATE 25 MG PO TABS
25 MG | ORAL_TABLET | ORAL | 1 refills | Status: AC
Start: 2016-06-06 — End: ?

## 2016-06-06 MED ORDER — SUMATRIPTAN SUCCINATE 50 MG PO TABS
50 MG | ORAL_TABLET | ORAL | 1 refills | Status: AC
Start: 2016-06-06 — End: ?

## 2016-06-06 NOTE — Progress Notes (Signed)
NEUROLOGY FOLLOW-UP  Patient Name:  Jennifer Stark  DOB:   1986/02/09  Clinic Visit Date: 06/06/2016    I saw Ms. Jennifer Stark in follow-up in the office today in continuation of neurologic care. She is a 30 y.o.  right handed Hispanic female initially seen in neurologic consultation on 05/16/16 for worsening headaches in frequency and severity. Today is her first follow up visit.       During the initial eval; she stated that she had headaches since October 2014 and they were occurring once a week or so.  But for the past couple of months, she has been having increasing headaches in frequency and severity.  There is change in the pattern of headaches.  She was having pressure-like headaches earlier and presently there are occurring with throbbing and pounding pain superimposed on pressure-like headaches located all over the head associated with numbness involving left side of the body predominantly in left approximate date.  She also has neck pain radiating down the left arm.  She does have numbness involving left upper extremity.  She does have limitation of neck movement.  Admits to activation of the neck pain with neck movement.  Has subjective weakness of left upper extremity.  Denies bladder dysfunction.  Admits to having intermittent electric shocklike sensation upon flexing the neck.  She also stated that progressive neck pain and left upper extremity symptomatology affecting her ability to work.  She has had history of injury to left side of the body in October 2014 where she was hit by a forklift on left side of the body with head injury.  She was not down onto the ground with loss of consciousness for a few seconds.  She woke up dizzy with the slurred speech lasting for 15 minutes.  At the time she was evaluated at [facility].  CT head in October 2014 was unremarkable.  She has been having worsening headaches in frequency and severity with the change in the pattern of the  headaches as stated above.  These headaches are described as throbbing and pounding headaches with daily pressure-like headaches located on back of the head and becoming generalized at times associated with nausea and vomiting.  She also has been having photophobia.  Denied phonophobia.  She also has been feeling dizzy with vertigo lasting for few minutes.  She feels like about to pass out but never loss consciousness.  She has no speech and swallowing difficulties.  These headaches are lasting for greater than a few hours up to 1 day and they are occurring at least 4 times a week.  She does have aura of abnormal sensation prior to having these headaches.  She does have intermittent bilateral upper extremity numbness, left greater than right.  She also has been feeling weakness in lower extremities with the neck pain.  Denied bladder dysfunction.  Admits to having intermittent clumsiness in the hands.  Denies bowel incontinence.  Admits to electric shocklike sensation upon bending the neck as stated above.  She has tried conservative measures without any help.  She has been taking Excedrin twice a day for 4 days in a week.  At times she takes Tylenol and ibuprofen almost on a daily basis with refractory headaches.       REVIEW OF SYSTEMS  Constitutional Weight changes: absent, Fevers : absent, Fatigue: absent; Any recent hospitalizations:  absent.   HEENT Ears: normal, Eyes: no corrective lenses, Visual disturbance: absent   Reespiratory Shortness of  breath: absent, Cough: absent   Cardivascular Chest pain: absent, Leg swelling :absent   GI Constipation: absent, Diarrhea: absent, Swallowing change: absent   GU Urinary frequency: absent, Urinary urgency: absent, Urinary incontinence: absent   Musculoskeletal Neck pain: present, Back pain: absent, Stiffness: absent, Muscle pain: absent, Joint pain: absent   Dermatological Hair loss: absent, Skin changes: absent   Neurological Memory loss: absent, Confusion: absent,  Seizures: absent; Trouble walking or imbalance: absent, Dizziness: present, Weakness: absent, Numbness absent, Tremor: absent, Spasm: absent, Speech difficulty: absent, Headache: present, Light sensitivity: present   Psychiatric Anxiety: present, Depression  present, Suicidal ideations absent   Hematologic Abnormal bleeding: absent, Anemia: absent, Clotting disorder: absent, Lymph gland changes: absent     Current Outpatient Prescriptions on File Prior to Visit   Medication Sig Dispense Refill   ??? amitriptyline (ELAVIL) 10 MG tablet Take one tab every evening for one week and then two tab every evening; after 2 wks; take 3 tab every evening. 90 tablet 1   ??? SUMAtriptan (IMITREX) 50 MG tablet Take 1 tab at onset of headache; may repeat in 2 hours; Max 2 tab/ day and 9/ month 9 tablet 1   ??? tiZANidine (ZANAFLEX) 2 MG tablet TAKE ONE TAB NIGHLTY 30 tablet 1     Current Facility-Administered Medications on File Prior to Visit   Medication Dose Route Frequency Provider Last Rate Last Dose   ??? sodium chloride flush 0.9 % injection 10 mL  10 mL Intravenous BID Jennifer Kalata, MD         Allergies: Jennifer Stark has No Known Allergies.    Past Medical History:   Diagnosis Date   ??? Head injury    ??? Headache     Migraine       History reviewed. No pertinent surgical history.  Social History: Jennifer Stark  reports that she has never smoked. She has never used smokeless tobacco. She reports that she drinks alcohol. She reports that she does not use illicit drugs.    History reviewed. No pertinent family history.    On exam: Blood pressure 98/62, pulse 61, height  (1.575 m), weight 112 lb (50.8 kg), not currently breastfeeding.    GENERAL  Appears uncomfortable and in distress   HEENT  NC/ AT   NECK  Supple and no bruits heard   MENTAL STATUS:  Alert, oriented, intact memory, no confusion, normal speech, normal language, no hallucination or delusion   CRANIAL NERVES: II     -      Visual fields intact to  confrontation  III,IV,VI -  EOMs full, no afferent defect, no                      INO, no ptosis  V     -     Normal facial sensation  VII    -     Normal facial symmetry  VIII   -     Intact hearing  IX,X -     Symmetrical palate  XI    -     Symmetrical shoulder shrug  XII   -     Midline tongue, no atrophy    MOTOR FUNCTION:  significant for good strength of grade 5/5 in bilateral proximal and distal muscle groups of both upper and lower extremities with normal bulk, normal tone and no involuntary movements, no tremor   SENSORY FUNCTION:  Normal touch, normal pin, normal vibration, normal  proprioception   CEREBELLAR FUNCTION:  Intact fine motor control over upper limbs   REFLEX FUNCTION:  Symmetric, no perverted reflex, no Babinski sign   STATION and GAIT  Normal station, normal gait       Diagnostic data reviewed with the patient:   CT Head (09/07/13): No acute intracranial abnormalities.  MRI brain (06/05/16): unremarkable.   MRI cervical spine (w/wo) (06/05/16): normal.        Impression and Plan: Jennifer Stark is a 30 y.o. female with   Chronic tension-type headaches, intractable; superimposed with intermittent migrainous attacks; patient has not initiated amitriptyline and also has not filled any of her meds; she is requesting scripts for prophylactic therapy.  Upon discussion regarding possible alternatives; she is willing to try Topamax at gradual dose escalation.  She was provided with a prescription for it.    Also discussed about rescue therapy and she was provided with sumatriptan script. She will use it for any headaches not associated with stroke like symptoms.   MRI brain and MRI cervical spine images are reviewed with the patient.  Medication counseling including risks and benefits along with headache hygiene measures discussed.  She verbalized understanding all of those instructions.  Follow-up in 4-6 weeks or sooner for further questions and concerns.    Please note that portions of  this note were completed with a voice recognition program.  Although every effort was made to ensure the accuracy of this  automated transcription, some errors in transcription may have occurred, occasionally words are mis-transcribed.

## 2016-06-18 ENCOUNTER — Encounter: Attending: Neurology | Primary: Family Medicine

## 2019-08-23 ENCOUNTER — Emergency Department (HOSPITAL_BASED_OUTPATIENT_CLINIC_OR_DEPARTMENT_OTHER): Payer: No Typology Code available for payment source

## 2019-08-23 ENCOUNTER — Emergency Department (HOSPITAL_BASED_OUTPATIENT_CLINIC_OR_DEPARTMENT_OTHER)
Admission: EM | Admit: 2019-08-23 | Discharge: 2019-08-23 | Disposition: A | Discharge status: Home / Self Care | Payer: No Typology Code available for payment source | Attending: Emergency Medicine | Admitting: Emergency Medicine

## 2019-08-23 ENCOUNTER — Other Ambulatory Visit: Payer: Self-pay

## 2019-08-23 ENCOUNTER — Encounter (HOSPITAL_BASED_OUTPATIENT_CLINIC_OR_DEPARTMENT_OTHER): Payer: Self-pay | Admitting: Emergency Medicine

## 2019-08-23 DIAGNOSIS — R079 Chest pain, unspecified: Secondary | ICD-10-CM | POA: Insufficient documentation

## 2019-08-23 DIAGNOSIS — Z87891 Personal history of nicotine dependence: Secondary | ICD-10-CM | POA: Diagnosis not present

## 2019-08-23 DIAGNOSIS — R0602 Shortness of breath: Secondary | ICD-10-CM | POA: Diagnosis not present

## 2019-08-23 DIAGNOSIS — Z79899 Other long term (current) drug therapy: Secondary | ICD-10-CM | POA: Insufficient documentation

## 2019-08-23 DIAGNOSIS — R0789 Other chest pain: Secondary | ICD-10-CM | POA: Diagnosis present

## 2019-08-23 HISTORY — DX: Insomnia, unspecified: G47.00

## 2019-08-23 HISTORY — DX: Depression, unspecified: F32.A

## 2019-08-23 HISTORY — DX: Attention-deficit hyperactivity disorder, unspecified type: F90.9

## 2019-08-23 LAB — TROPONIN I (HIGH SENSITIVITY): Troponin I (High Sensitivity): <2 ng/L (ref <18)

## 2019-08-23 NOTE — ED Triage Notes (Signed)
SOB and mild mid sternal CP x4 days.  Started taking Ritalin the day before.  She called her pmd but they did not call her back until today and told her to stop the Ritalin and go to the ED. Pt sts the SOB and CP are mild but they are not normal for her.

## 2019-08-24 NOTE — ED Provider Notes (Signed)
[facility] EMERGENCY DEPARTMENT Provider Note   CSN: [id number] Arrival date & time: 08/23/19  1237     History   Chief Complaint Chief Complaint  Patient presents with  . Shortness of Breath    HPI Asyria Kolander is a 33 y.o. female.     HPI   33 year old female with chest pressure.  Onset about 4 days ago.  Began shortly after starting any medication, Ritalin.  She describes a constant pressure in her midsternal region.  No appreciable exacerbating relieving factors.  She denies any associated symptoms to me.  Specifically denies dyspnea.  No palpitations or diaphoresis.  No unusual leg pain or swelling.  Denies any past history of reflux.  Past Medical History:  Diagnosis Date  . ADHD   . Depression   . Insomnia     There are no active problems to display for this patient.   History reviewed. No pertinent surgical history.   OB History   No obstetric history on file.      Home Medications    Prior to Admission medications   Medication Sig Start Date End Date Taking? Authorizing Provider  FLUoxetine (PROZAC) 20 MG capsule Take 20 mg by mouth daily.   Yes [provider]  hydrOXYzine (ATARAX/VISTARIL) 25 MG tablet Take 25 mg by mouth at bedtime as needed.   Yes [provider]  methylphenidate (RITALIN) 10 MG tablet Take 10 mg by mouth daily.   Yes [provider]    Family History No family history on file.  Social History Social History   Tobacco Use  . Smoking status: Former Games developer  . Smokeless tobacco: Never Used  Substance Use Topics  . Alcohol use: Yes    Comment: rarely  . Drug use: Never     Allergies   Patient has no known allergies.   Review of Systems Review of Systems  All systems reviewed and negative, other than as noted in HPI.  Physical Exam Updated Vital Signs BP 114/74 (BP Location: Left Arm)   Pulse 72   Temp 98.3 F (36.8 C) (Oral)   Resp 20   Ht 5\' 2"  (1.575 m)   Wt 54.4  kg   LMP 08/16/2019   SpO2 100%   BMI 21.95 kg/m   Physical Exam Vitals signs and nursing note reviewed.  Constitutional:      General: She is not in acute distress.    Appearance: She is well-developed.  HENT:     Head: Normocephalic and atraumatic.  Eyes:     General:        Right eye: No discharge.        Left eye: No discharge.     Conjunctiva/sclera: Conjunctivae normal.  Neck:     Musculoskeletal: Neck supple.  Cardiovascular:     Rate and Rhythm: Normal rate and regular rhythm.     Heart sounds: Normal heart sounds. No murmur. No friction rub. No gallop.   Pulmonary:     Effort: Pulmonary effort is normal. No respiratory distress.     Breath sounds: Normal breath sounds.  Abdominal:     General: There is no distension.     Palpations: Abdomen is soft.     Tenderness: There is no abdominal tenderness.  Musculoskeletal:        General: No tenderness.     Comments: Lower extremities symmetric as compared to each other. No calf tenderness. Negative Homan's. No palpable cords.   Skin:    General:  Skin is warm and dry.  Neurological:     Mental Status: She is alert.  Psychiatric:        Behavior: Behavior normal.        Thought Content: Thought content normal.      ED Treatments / Results  Labs (all labs ordered are listed, but only abnormal results are displayed) Labs Reviewed  TROPONIN I (HIGH SENSITIVITY)  TROPONIN I (HIGH SENSITIVITY)    EKG EKG Interpretation  Date/Time:  Monday August 23 2019 13:24:09 EDT Ventricular Rate:  64 PR Interval:    QRS Duration: 83 QT Interval:  417 QTC Calculation: 431 R Axis:   84 Text Interpretation:  Sinus rhythm Non-specific ST-t changes Confirmed by Virgel Manifold [id number]) on 08/23/2019 1:38:41 PM   Radiology Dg Chest 2 View  Result Date: 08/23/2019 CLINICAL DATA:  Shortness of breath for 3 days, chest pain and midline for 4 days, former smoker EXAM: CHEST - 2 VIEW COMPARISON:  None FINDINGS: Normal heart  size, mediastinal contours, and pulmonary vascularity. Lungs clear. No pleural effusion or pneumothorax. Minimal levoconvex scoliosis of the upper thoracic spine. IMPRESSION: No acute abnormalities. Electronically Signed   By: Lavonia Dana M.D.   On: 08/23/2019 13:44    Procedures Procedures (including critical care time)  Medications Ordered in ED Medications - No data to display   Initial Impression / Assessment and Plan / ED Course  I have reviewed the triage vital signs and the nursing notes.  Pertinent labs & imaging results that were available during my care of the patient were reviewed by me and considered in my medical decision making (see chart for details).        33 year old female with some chest discomfort.  I doubt ACS.  Atypical symptoms given constant duration for several days at this point.  Her EKG is somewhat abnormal, but there is none for comparison purposes.  High-sensitivity troponin is normal with several days of symptoms at this point.  Consider PE, dissection, pericarditis/myocarditis, but overall I have a very low suspicion for emergent process.  I am not sure this is medication related or not, but she was already advised by the prescribing doctor to stop the Ritalin.  Return precautions were discussed.  Outpatient follow-up otherwise.  Final Clinical Impressions(s) / ED Diagnoses   Final diagnoses:  Chest pain, unspecified type    ED Discharge Orders    None       Virgel Manifold, MD 08/24/19 [phone]

## 2020-05-11 IMAGING — CR DG CHEST 2V
2 series · 2 of 2 positions shown · non-contrast
Comparison: None

CLINICAL DATA: Shortness of breath for 3 days, chest pain and
midline for 4 days, former smoker

EXAM:
CHEST - 2 VIEW

[w chest pa]
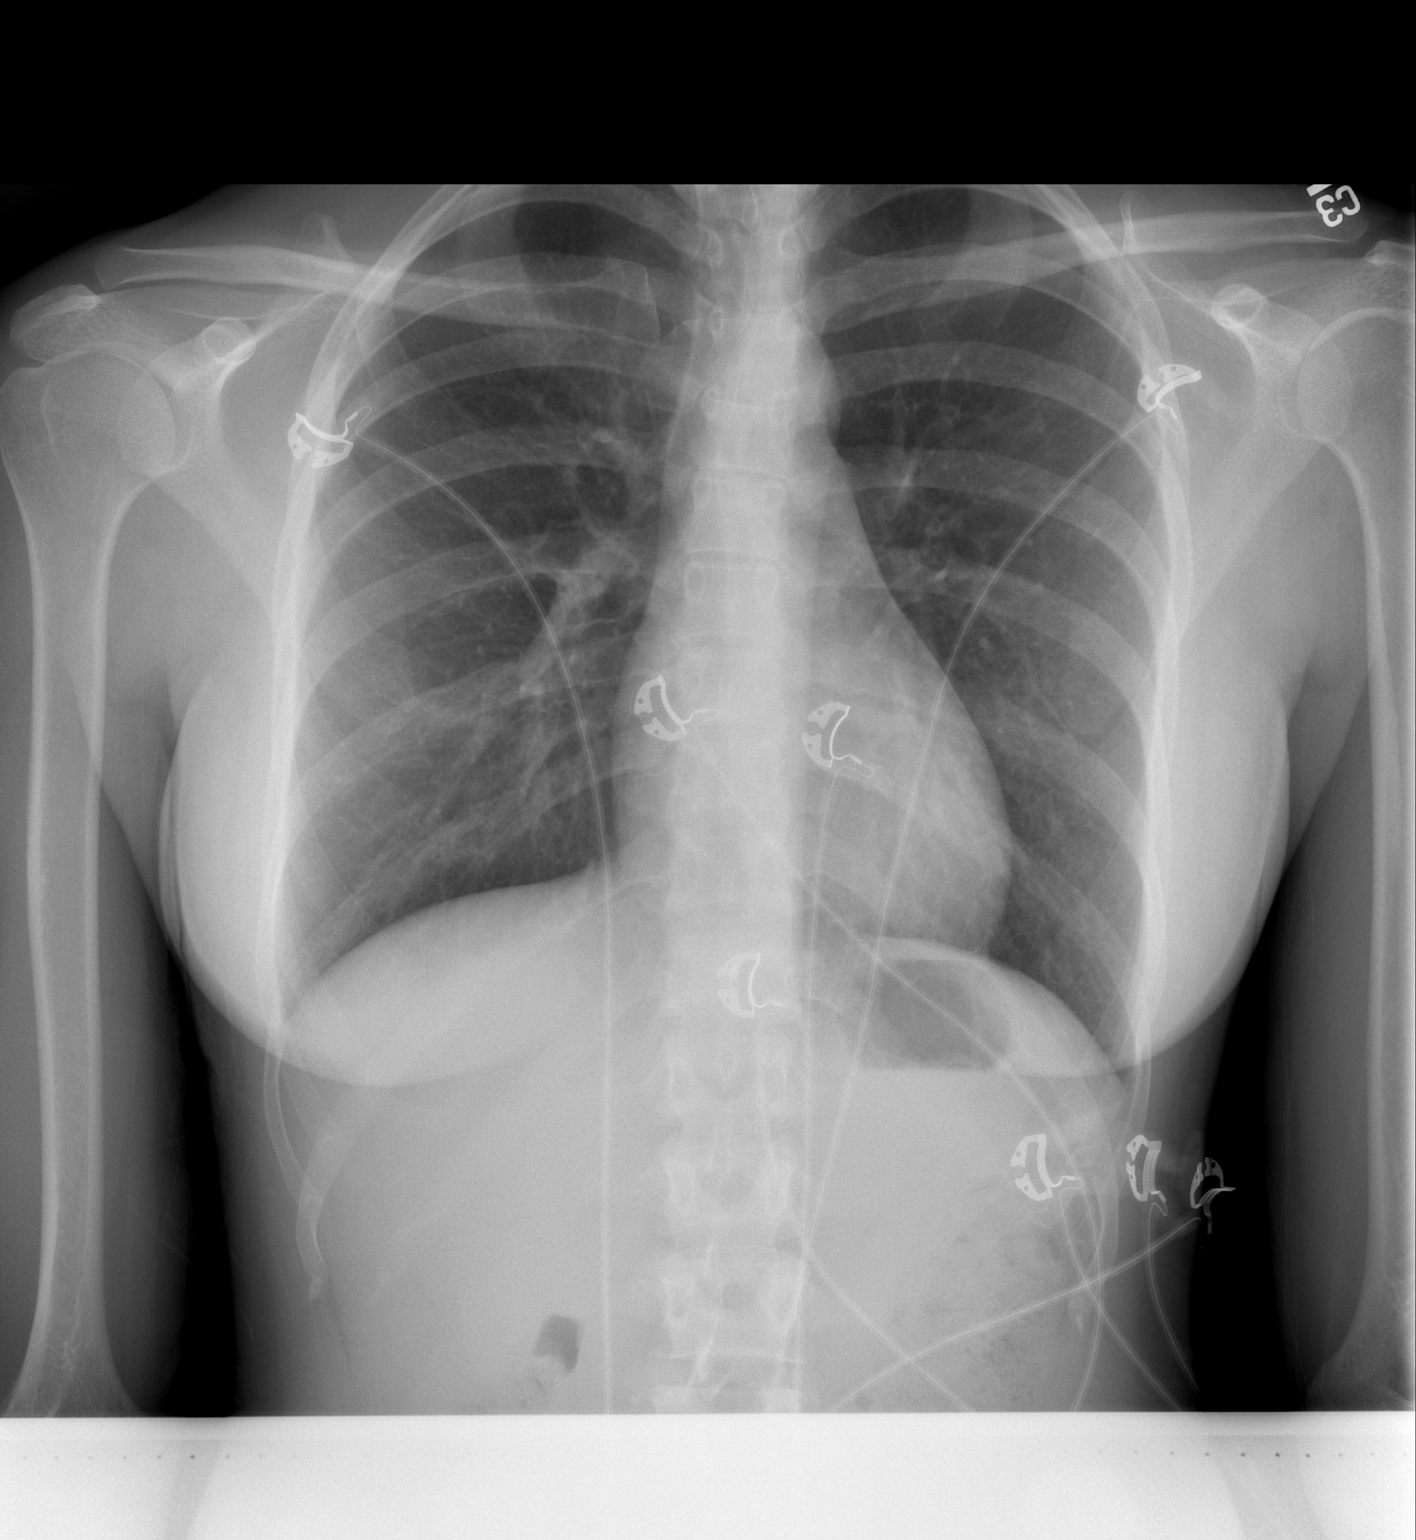

[w chest lat]
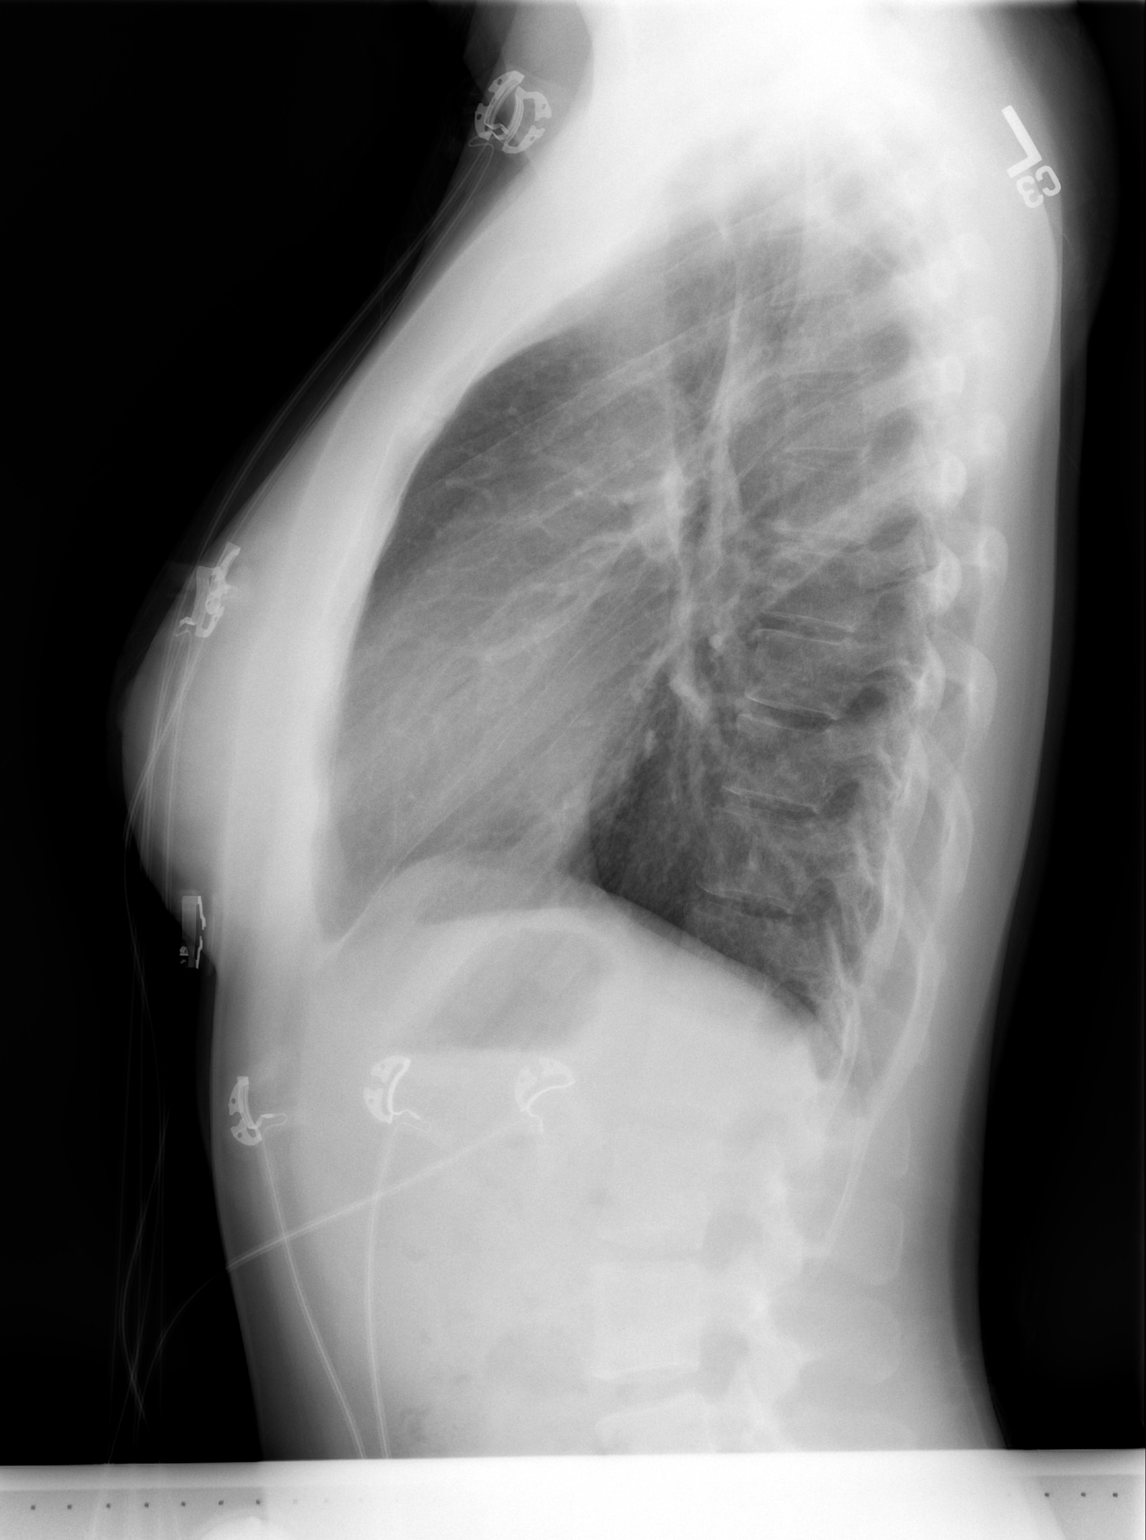

[2 of 2 positions shown; findings below may reference images not displayed]

FINDINGS: Normal heart size, mediastinal contours, and pulmonary vascularity.

Lungs clear.

No pleural effusion or pneumothorax.

Minimal levoconvex scoliosis of the upper thoracic spine.
IMPRESSION: No acute abnormalities.
